# Patient Record
Sex: Male | Born: 1990 | Race: Black or African American | Hispanic: No | Marital: Single | Smoking: Never smoker
Health system: Southern US, Community
[De-identification: ages and names within clinical notes are randomized; demographics above are authoritative.]

## PROBLEM LIST (undated history)

## (undated) DIAGNOSIS — J45909 Unspecified asthma, uncomplicated: Secondary | ICD-10-CM

---

## 2015-01-18 ENCOUNTER — Emergency Department (HOSPITAL_COMMUNITY): Payer: Self-pay

## 2015-01-18 ENCOUNTER — Encounter (HOSPITAL_COMMUNITY): Payer: Self-pay | Admitting: *Deleted

## 2015-01-18 ENCOUNTER — Observation Stay (HOSPITAL_COMMUNITY)
Admission: EM | Admit: 2015-01-18 | Discharge: 2015-01-18 | Disposition: A | Discharge status: Home / Self Care | Payer: Self-pay | Attending: Oncology | Admitting: Oncology

## 2015-01-18 DIAGNOSIS — R0789 Other chest pain: Secondary | ICD-10-CM | POA: Insufficient documentation

## 2015-01-18 DIAGNOSIS — R4182 Altered mental status, unspecified: Principal | ICD-10-CM | POA: Diagnosis present

## 2015-01-18 DIAGNOSIS — S00531A Contusion of lip, initial encounter: Secondary | ICD-10-CM | POA: Insufficient documentation

## 2015-01-18 DIAGNOSIS — R079 Chest pain, unspecified: Secondary | ICD-10-CM

## 2015-01-18 DIAGNOSIS — Z113 Encounter for screening for infections with a predominantly sexual mode of transmission: Secondary | ICD-10-CM | POA: Insufficient documentation

## 2015-01-18 DIAGNOSIS — J069 Acute upper respiratory infection, unspecified: Secondary | ICD-10-CM | POA: Insufficient documentation

## 2015-01-18 HISTORY — DX: Unspecified asthma, uncomplicated: J45.909

## 2015-01-18 LAB — COMPREHENSIVE METABOLIC PANEL
ALT: 19 U/L (ref 17–63)
ANION GAP: 9 (ref 5–15)
AST: 22 U/L (ref 15–41)
Albumin: 4.1 g/dL (ref 3.5–5.0)
Alkaline Phosphatase: 83 U/L (ref 38–126)
BUN: 7 mg/dL (ref 6–20)
CO2: 27 mmol/L (ref 22–32)
Calcium: 9.3 mg/dL (ref 8.9–10.3)
Chloride: 103 mmol/L (ref 101–111)
Creatinine, Ser: 1.02 mg/dL (ref 0.61–1.24)
GFR calc Af Amer: >60 mL/min (ref >60)
GFR calc non Af Amer: >60 mL/min (ref >60)
Glucose, Bld: 88 mg/dL (ref 65–99)
Potassium: 3.6 mmol/L (ref 3.5–5.1)
Sodium: 139 mmol/L (ref 135–145)
TOTAL PROTEIN: 7 g/dL (ref 6.5–8.1)
Total Bilirubin: 0.8 mg/dL (ref 0.3–1.2)

## 2015-01-18 LAB — CBC WITH DIFFERENTIAL/PLATELET
BASOS ABS: 0.1 10*3/uL (ref 0.0–0.1)
BASOS PCT: 1 % (ref 0–1)
Eosinophils Absolute: 0.2 10*3/uL (ref 0.0–0.7)
Eosinophils Relative: 3 % (ref 0–5)
HCT: 40.6 % (ref 39.0–52.0)
HEMOGLOBIN: 13.6 g/dL (ref 13.0–17.0)
Lymphocytes Relative: 17 % (ref 12–46)
Lymphs Abs: 1.3 10*3/uL (ref 0.7–4.0)
MCH: 29.1 pg (ref 26.0–34.0)
MCHC: 33.5 g/dL (ref 30.0–36.0)
MCV: 86.8 fL (ref 78.0–100.0)
MONOS PCT: 7 % (ref 3–12)
Monocytes Absolute: 0.6 10*3/uL (ref 0.1–1.0)
Neutro Abs: 5.4 10*3/uL (ref 1.7–7.7)
Neutrophils Relative %: 72 % (ref 43–77)
Platelets: 256 10*3/uL (ref 150–400)
RBC: 4.68 MIL/uL (ref 4.22–5.81)
RDW: 12.1 % (ref 11.5–15.5)
WBC: 7.5 10*3/uL (ref 4.0–10.5)

## 2015-01-18 LAB — I-STAT ARTERIAL BLOOD GAS, ED
ACID-BASE DEFICIT: 2 mmol/L (ref 0.0–2.0)
BICARBONATE: 23.5 meq/L (ref 20.0–24.0)
O2 Saturation: 97 %
PCO2 ART: 39.9 mmHg (ref 35.0–45.0)
TCO2: 25 mmol/L (ref 0–100)
pH, Arterial: 7.378 (ref 7.350–7.450)
pO2, Arterial: 91 mmHg (ref 80.0–100.0)

## 2015-01-18 LAB — RAPID URINE DRUG SCREEN, HOSP PERFORMED
Amphetamines: NOT DETECTED
Barbiturates: NOT DETECTED
Benzodiazepines: POSITIVE — AB
Cocaine: NOT DETECTED
Opiates: NOT DETECTED
TETRAHYDROCANNABINOL: POSITIVE — AB

## 2015-01-18 LAB — RAPID STREP SCREEN (MED CTR MEBANE ONLY): STREPTOCOCCUS, GROUP A SCREEN (DIRECT): NEGATIVE

## 2015-01-18 LAB — RPR: RPR Ser Ql: NONREACTIVE

## 2015-01-18 LAB — HIV ANTIBODY (ROUTINE TESTING W REFLEX): HIV SCREEN 4TH GENERATION: NONREACTIVE

## 2015-01-18 LAB — ETHANOL: Alcohol, Ethyl (B): <5 mg/dL (ref <5)

## 2015-01-18 MED ORDER — SODIUM CHLORIDE 0.9 % IV BOLUS (SEPSIS)
1000.0000 mL | Freq: Once | INTRAVENOUS | Status: AC
  Administered 2015-01-18: 1000 mL via INTRAVENOUS

## 2015-01-18 NOTE — ED Notes (Signed)
Pa notified of GCS and continued snoring.  C-collar placed.
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
## 2015-01-18 NOTE — ED Notes (Signed)
Pt sat up and more alert.  GCS now 15.  IV placed.
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
## 2015-01-18 NOTE — ED Notes (Signed)
PT with snoring RR and response only to painful stimuli.  PA notified.
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
## 2015-01-21 LAB — CULTURE, GROUP A STREP: STREP A CULTURE: NEGATIVE
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
